# Patient Record
Sex: Male | Born: 1960
Health system: Southern US, Community
[De-identification: ages and names within clinical notes are randomized; demographics above are authoritative.]

---

## 2007-05-04 ENCOUNTER — Emergency Department: Payer: Self-pay | Admitting: Emergency Medicine

## 2007-05-05 ENCOUNTER — Other Ambulatory Visit: Payer: Self-pay

## 2016-01-26 DIAGNOSIS — F17200 Nicotine dependence, unspecified, uncomplicated: Secondary | ICD-10-CM | POA: Insufficient documentation

## 2016-01-26 DIAGNOSIS — F1410 Cocaine abuse, uncomplicated: Secondary | ICD-10-CM | POA: Insufficient documentation

## 2018-01-01 ENCOUNTER — Emergency Department: Payer: BLUE CROSS/BLUE SHIELD

## 2018-01-01 ENCOUNTER — Encounter: Payer: Self-pay | Admitting: Intensive Care

## 2018-01-01 ENCOUNTER — Emergency Department
Admission: EM | Admit: 2018-01-01 | Discharge: 2018-01-01 | Disposition: A | Discharge status: Home / Self Care | Payer: BLUE CROSS/BLUE SHIELD | Attending: Emergency Medicine | Admitting: Emergency Medicine

## 2018-01-01 DIAGNOSIS — M47816 Spondylosis without myelopathy or radiculopathy, lumbar region: Secondary | ICD-10-CM

## 2018-01-01 DIAGNOSIS — M479 Spondylosis, unspecified: Secondary | ICD-10-CM | POA: Insufficient documentation

## 2018-01-01 DIAGNOSIS — Z79899 Other long term (current) drug therapy: Secondary | ICD-10-CM | POA: Diagnosis not present

## 2018-01-01 DIAGNOSIS — M545 Low back pain: Secondary | ICD-10-CM | POA: Diagnosis present

## 2018-01-01 DIAGNOSIS — F17210 Nicotine dependence, cigarettes, uncomplicated: Secondary | ICD-10-CM | POA: Diagnosis not present

## 2018-01-01 MED ORDER — TRAMADOL HCL 50 MG PO TABS: 50.0000 mg | ORAL_TABLET | Freq: Four times a day (QID) | ORAL | 0 refills | Status: DC | PRN

## 2018-01-01 MED ORDER — MELOXICAM 15 MG PO TABS: 15.0000 mg | ORAL_TABLET | Freq: Every day | ORAL | 2 refills | Status: DC

## 2018-01-01 MED ORDER — NAPROXEN 500 MG PO TABS
500.0000 mg | ORAL_TABLET | Freq: Once | ORAL | Status: AC
  Administered 2018-01-01: 500 mg via ORAL
  Filled 2018-01-01: qty 1

## 2018-01-01 MED ORDER — CYCLOBENZAPRINE HCL 10 MG PO TABS: 10.0000 mg | ORAL_TABLET | Freq: Three times a day (TID) | ORAL | 0 refills | Status: DC | PRN

## 2018-01-01 MED ORDER — TRAMADOL HCL 50 MG PO TABS
50.0000 mg | ORAL_TABLET | Freq: Once | ORAL | Status: AC
  Administered 2018-01-01: 50 mg via ORAL
  Filled 2018-01-01: qty 1

## 2018-01-01 MED ORDER — CYCLOBENZAPRINE HCL 10 MG PO TABS
10.0000 mg | ORAL_TABLET | Freq: Once | ORAL | Status: AC
  Administered 2018-01-01: 10 mg via ORAL
  Filled 2018-01-01: qty 1

## 2018-01-01 NOTE — ED Triage Notes (Signed)
Patient c/o chronic lower back pain from pinched nerve. States it has progressively gotten worse within the last two days.

## 2018-01-01 NOTE — ED Notes (Signed)
See triage note  Presents with lower back pain   Hx of back pain d/t pinched nerve  States pain has increased over the past 2 days  Denies any recent injury

## 2018-01-01 NOTE — Discharge Instructions (Signed)
Advised to establish care with PCP. °

## 2018-01-01 NOTE — ED Provider Notes (Signed)
[facility] Emergency Department Provider Note   ____________________________________________   First MD Initiated Contact with Patient 01/01/18 1139     (approximate)  I have reviewed the triage vital signs and the nursing notes.   HISTORY  Chief Complaint Back Pain (lower; pinched nerve)    HPI Jonathan Mccarthy is a 57 y.o. male patient presents with chronic back pain please have a pinched nerve.  Pain is worse in the past 2 days.  Patient denies radicular component to his back pain.  Patient denies bladder bowel dysfunction.  Patient rates pain as a 9/10.  Patient described the pain is "aching".  No palliative measure for complaint.  History reviewed. No pertinent past medical history.  There are no active problems to display for this patient.   History reviewed. No pertinent surgical history.  Prior to Admission medications   Medication Sig Start Date End Date Taking? Authorizing Provider  cyclobenzaprine (FLEXERIL) 10 MG tablet Take 1 tablet (10 mg total) by mouth 3 (three) times daily as needed. 01/01/18   Joni Reining, PA-C  meloxicam (MOBIC) 15 MG tablet Take 1 tablet (15 mg total) by mouth daily. 01/01/18   Joni Reining, PA-C  traMADol (ULTRAM) 50 MG tablet Take 1 tablet (50 mg total) by mouth every 6 (six) hours as needed. 01/01/18 01/01/19  Joni Reining, PA-C    Allergies Patient has no known allergies.  History reviewed. No pertinent family history.  Social History Social History   Tobacco Use  . Smoking status: Current Every Day Smoker    Types: Cigarettes  . Smokeless tobacco: Never Used  Substance Use Topics  . Alcohol use: Yes  . Drug use: No    Review of Systems Constitutional: No fever/chills Eyes: No visual changes. ENT: No sore throat. Cardiovascular: Denies chest pain. Respiratory: Denies shortness of breath. Gastrointestinal: No abdominal pain.  No nausea, no vomiting.  No diarrhea.  No  constipation. Genitourinary: Negative for dysuria. Musculoskeletal: Chronic back pain Skin: Negative for rash. Neurological: Negative for headaches, focal weakness or numbness.   ____________________________________________   PHYSICAL EXAM:  VITAL SIGNS: ED Triage Vitals  Enc Vitals Group     BP 01/01/18 1119 134/90     Pulse Rate 01/01/18 1119 69     Resp 01/01/18 1119 16     Temp 01/01/18 1119 98.2 F (36.8 C)     Temp Source 01/01/18 1119 Oral     SpO2 01/01/18 1119 100 %     Weight 01/01/18 1120 135 lb (61.2 kg)     Height 01/01/18 1120 5\' 6"  (1.676 m)     Head Circumference --      Peak Flow --      Pain Score 01/01/18 1119 9     Pain Loc --      Pain Edu? --      Excl. in GC? --    Constitutional: Alert and oriented. Well appearing and in no acute distress. Eyes: Conjunctivae are normal. PERRL. EOMI. Head: Atraumatic. Nose: No congestion/rhinnorhea. Mouth/Throat: Mucous membranes are moist.  Oropharynx non-erythematous. Neck: No stridor.  Hematological/Lymphatic/Immunilogical: No cervical lymphadenopathy. Cardiovascular: Normal rate, regular rhythm. Grossly normal heart sounds.  Good peripheral circulation. Respiratory: Normal respiratory effort.  No retractions. Lungs CTAB. Gastrointestinal: Soft and nontender. No distention. No abdominal bruits. No CVA tenderness. Musculoskeletal: Patient is a regular lumbar spine show scoliosis.  Patient has moderate guarding palpation of L3 through L5.  Patient has decreased range of motion with  flexion limited by complaint of pain.  Patient has normal gait.  Patient has negative straight leg test.   Neurologic:  Normal speech and language. No gross focal neurologic deficits are appreciated. No gait instability. Skin:  Skin is warm, dry and intact. No rash noted. Psychiatric: Mood and affect are normal. Speech and behavior are normal.  ____________________________________________   LABS (all labs ordered are listed, but only  abnormal results are displayed)  Labs Reviewed - No data to display ____________________________________________  EKG   ____________________________________________  RADIOLOGY  ED MD interpretation: Degenerative changes with osteophytes from L3 through L5.  Patient also have moderate disc space loss between L3 through L5.  Official radiology report(s): Dg Lumbar Spine 2-3 Views  Result Date: 01/01/2018 CLINICAL DATA:  57 year old male with left side lumbar back pain radiating to both legs. Radicular back pain. EXAM: LUMBAR SPINE - 2-3 VIEW COMPARISON:  None. FINDINGS: Normal lumbar segmentation. Vestigial S1-S2 disc space. In general bone mineralization is normal. There is mild dextroconvex lumbar scoliosis associated with straightening of lumbar lordosis. There are diffuse bulky endplate osteophytes throughout the lumbar spine, most eccentric to the left and maximal at L3-L4 through L5-S1. Associated severe disc space loss and some vacuum disc phenomena at those levels. Associated endplate sclerosis. No acute osseous abnormality identified. The sacral ala and SI joints appear normal. IMPRESSION: 1. Diffuse bulky lumbar degenerative endplate osteophytes. Chronically advanced lumbar disc degeneration L3-L4 through L5-S1. 2. Mild dextroconvex lumbar spine curvature and straightening of lumbar lordosis. 3.  No acute osseous abnormality identified. Electronically Signed   By: Odessa FlemingH  Hall M.D.   On: 01/01/2018 12:11    ____________________________________________   PROCEDURES  Procedure(s) performed: None  Procedures  Critical Care performed: No  ____________________________________________   INITIAL IMPRESSION / ASSESSMENT AND PLAN / ED COURSE  As part of my medical decision making, I reviewed the following data within the electronic MEDICAL RECORD NUMBER Notes from prior ED visits and [place] Controlled Substance Database   Chronic low back pain secondary to osteoarthritis.  Discussed x-ray  findings with patient.  Discussed with patient rationale for having a family doctor.  Patient given discharge care instruction advised take medication as directed.      ____________________________________________   FINAL CLINICAL IMPRESSION(S) / ED DIAGNOSES  Final diagnoses:  Osteoarthritis of facet joint of lumbar spine     ED Discharge Orders        Ordered    meloxicam (MOBIC) 15 MG tablet  Daily     01/01/18 1223    cyclobenzaprine (FLEXERIL) 10 MG tablet  3 times daily PRN     01/01/18 1223    traMADol (ULTRAM) 50 MG tablet  Every 6 hours PRN     01/01/18 1223       Note:  This document was prepared using Dragon voice recognition software and may include unintentional dictation errors.    Joni ReiningSmith, Dartanyan Deasis K, PA-C 01/01/18 1229    Governor RooksLord, Rebecca, MD 01/01/18 [phone]

## 2018-01-21 ENCOUNTER — Other Ambulatory Visit: Payer: Self-pay

## 2018-01-21 ENCOUNTER — Encounter: Payer: Self-pay | Admitting: Family Medicine

## 2018-01-21 ENCOUNTER — Ambulatory Visit: Payer: Self-pay | Admitting: Family Medicine

## 2018-01-21 ENCOUNTER — Ambulatory Visit: Payer: BLUE CROSS/BLUE SHIELD | Admitting: Family Medicine

## 2018-01-21 VITALS — BP 130/70 | HR 96 | Temp 98.2°F | Resp 18 | Ht 66.0 in | Wt 135.0 lb

## 2018-01-21 DIAGNOSIS — M545 Low back pain, unspecified: Secondary | ICD-10-CM | POA: Insufficient documentation

## 2018-01-21 DIAGNOSIS — F17200 Nicotine dependence, unspecified, uncomplicated: Secondary | ICD-10-CM | POA: Diagnosis not present

## 2018-01-21 DIAGNOSIS — F1011 Alcohol abuse, in remission: Secondary | ICD-10-CM

## 2018-01-21 DIAGNOSIS — Z131 Encounter for screening for diabetes mellitus: Secondary | ICD-10-CM | POA: Diagnosis not present

## 2018-01-21 DIAGNOSIS — Z7689 Persons encountering health services in other specified circumstances: Secondary | ICD-10-CM

## 2018-01-21 DIAGNOSIS — Z87898 Personal history of other specified conditions: Secondary | ICD-10-CM

## 2018-01-21 DIAGNOSIS — R5383 Other fatigue: Secondary | ICD-10-CM

## 2018-01-21 DIAGNOSIS — R202 Paresthesia of skin: Secondary | ICD-10-CM

## 2018-01-21 DIAGNOSIS — Z1159 Encounter for screening for other viral diseases: Secondary | ICD-10-CM

## 2018-01-21 DIAGNOSIS — Z114 Encounter for screening for human immunodeficiency virus [HIV]: Secondary | ICD-10-CM

## 2018-01-21 DIAGNOSIS — M542 Cervicalgia: Secondary | ICD-10-CM

## 2018-01-21 DIAGNOSIS — G8929 Other chronic pain: Secondary | ICD-10-CM | POA: Diagnosis not present

## 2018-01-21 DIAGNOSIS — M549 Dorsalgia, unspecified: Secondary | ICD-10-CM

## 2018-01-21 DIAGNOSIS — Z13220 Encounter for screening for lipoid disorders: Secondary | ICD-10-CM

## 2018-01-21 NOTE — Progress Notes (Signed)
Name: Jonathan Mccarthy   MRN: [id number]    DOB: 12/07/1960   Date:01/21/2018       Progress Note  Subjective  Chief Complaint  Chief Complaint  Patient presents with  . Establish Care  . Back Pain    Seen in ER for back pain. Painradiates to arms and legs, hard to move, lift things or walk. Patient stated no known injury. Woke up like this.  . Neck Pain    HPI  Pt presents to establish care and for concern regarding back pain with extremity weakness. He had never had a PCP in the past as far as he can remember.  He was seen in the ER on 01/01/2018 for back pain.  He was given Meloxicam (once daily), flexeril (once daily), and tramadol (once daily).  Advised on our office policy that we do not prescribe narcotic pain medication and patient verbalizes understanding.  He does not feel like these medications have been helping his pain or strength.  Labs were not performed in ER, Xray as below  Xray Lumbar spine showed: "1. Diffuse bulky lumbar degenerative endplate osteophytes. Chronically advanced lumbar disc degeneration L3-L4 through L5-S1. 2. Mild dextroconvex lumbar spine curvature and straightening of lumbar lordosis. 3.  No acute osseous abnormality identified. Electronically Signed   By: H  Hall M.D.   On: 01/01/2018 12:11"  - Currently he endorses numbness and tingling in bilateral shoulders radiating from the neck; feels weak in BLE and reports this has been affecting his gait. - Denies any tremor or shaking.  He endorses one episode of incontinence because he was unable to get up fast enough due to feeling weak in BLE.  Denies headaches, chest pain, shortness of breath, vision changes, heat/cold intolerance, fevers/chills.  - He notes chronic LEFT arm weakness and decreased range of motion for several years. Also endorses chronic BUE weakness for several years.  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  He also has BLE weakness that has been making it  difficult for him to walk which has been an ongoing issue for several months. He works for [employer] in the [place] - has not been working for 3 weeks now due to symptoms.  He states that although symptoms have been a chronic issue, they have significantly worsened over the last 3 weeks. - ETOH Use: Drinks about 2-3 40oz every weekend.  - Drug Use: States used to use marijuana, cocaine - has not used since 2017 per patient report. 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  Patient Active Problem List   Diagnosis Date Noted  . Chronic bilateral low back pain without sciatica 01/21/2018  . Chronic neck and back pain 01/21/2018  . Paresthesias 01/21/2018  . Other fatigue 01/21/2018  . Cocaine abuse (HCC) 01/26/2016  . Tobacco use disorder 01/26/2016   History reviewed. No pertinent surgical history.  History reviewed. No pertinent family history.  Social History   Socioeconomic History  . Marital status: Single    Spouse name: Not on file  . Number of children: Not on file  . Years of education: Not on file  . Highest education level: Not on file  Social Needs  . Financial resource strain: Not on file  . Food insecurity - worry: Not on file  . Food insecurity - inability: Not on file  . Transportation needs - medical: Not on file  . Transportation needs - non-medical: Not on file    Occupational History  . Not on file  Tobacco Use  . Smoking status: Current Every Day Smoker    Types: Cigarettes  . Smokeless tobacco: Never Used  Substance and Sexual Activity  . Alcohol use: Yes  . Drug use: No  . Sexual activity: Not on file  Other Topics Concern  . Not on file  Social History Narrative  . Not on file     Current Outpatient Medications:  .  cyclobenzaprine (FLEXERIL) 10 MG tablet, Take 1 tablet (10 mg total) by mouth 3 (three) times daily as needed., Disp: 15 tablet, Rfl: 0 .  meloxicam (MOBIC) 15 MG tablet, Take 1 tablet (15  mg total) by mouth daily., Disp: 30 tablet, Rfl: 2 .  traMADol (ULTRAM) 50 MG tablet, Take 1 tablet (50 mg total) by mouth every 6 (six) hours as needed., Disp: 20 tablet, Rfl: 0  No Known Allergies  ROS  Ten systems reviewed and is negative except as mentioned in HPI  Objective  Vitals:   01/21/18 0833 01/21/18 0836  BP: 130/70   Pulse: (!) 103 96  Resp: 18   Temp: 98.2 F (36.8 C)   TempSrc: Oral   SpO2: 96%   Weight: 135 lb (61.2 kg)   Height: 5' 6" (1.676 m)    Body mass index is 21.79 kg/m. auscultated heart   Physical Exam Constitutional: Patient appears well-developed and well-nourished. No distress.  HENT: Head: Normocephalic and atraumatic.  Mouth/Throat: Oropharynx is clear and moist. No oropharyngeal exudate.  Eyes: Conjunctivae and EOM are normal. Pupils are equal, round, and reactive to light. No scleral icterus.  Neck: Normal range of motion. Neck supple. No JVD present. No thyromegaly present.  Cardiovascular: Normal rate, regular rhythm and normal heart sounds.  No murmur heard. No BLE edema. Pulmonary/Chest: Effort normal and breath sounds normal. No respiratory distress. Abdominal: Soft. There is no tenderness. no masses Musculoskeletal: Normal range of motion, no joint effusions. No gross deformities Neurological: he is alert and oriented to person, place, and time. No cranial nerve deficit. Coordination and speech are normal.  Gait is unsteady - needs to hold onto rail/wall to ambulate.  Strength - equal and + 4 to BLE, +2 to BUE with decreased AROM of the LEFT shoulder joint. Bilaterally. Skin: Skin is warm and dry. No rash noted. No erythema.  Psychiatric: Patient has a normal mood and affect. behavior is normal. Judgment and thought content normal.  No results found for this or any previous visit (from the past 72 hour(s)).  PHQ2/9: Depression screen PHQ 2/9 01/21/2018  Decreased Interest 0  Down, Depressed, Hopeless 0  PHQ - 2 Score 0   Fall  Risk: Fall Risk  01/21/2018  Falls in the past year? No  Risk for fall due to : Impaired balance/gait;Impaired mobility   Functional Status Survey: Is the patient deaf or have difficulty hearing?: No Does the patient have difficulty seeing, even when wearing glasses/contacts?: No Does the patient have difficulty concentrating, remembering, or making decisions?: No Does the patient have difficulty walking or climbing stairs?: Yes Does the patient have difficulty dressing or bathing?: Yes Does the patient have difficulty doing errands alone such as visiting a doctor's office or shopping?: Yes  Assessment & Plan  1. Chronic bilateral low back pain without sciatica - Nerve conduction test; Future  2. Chronic neck and back pain - Nerve conduction test; Future  3. Paresthesias - Nerve conduction test; Future - COMPLETE METABOLIC PANEL WITH GFR - Vitamin B1 -  Vitamin B12  4. Other fatigue - TSH - CBC w/Diff/Platelet - COMPLETE METABOLIC PANEL WITH GFR - Vitamin B1 - Vitamin B12 - Hemoglobin A1c  5. Diabetes mellitus screening - COMPLETE METABOLIC PANEL WITH GFR - Hemoglobin A1c  6. Screening for hyperlipidemia - Lipid panel  7. Encounter to establish care Return in about 2 weeks (around 02/04/2018) for 2 week Follow Up/Schedule CPE when able . - Discussed patient's present symptoms and situation at length.  He lives alone, we discussed fall prevention precautions in detail.  He cooks and does his own grocery shopping.  He declines social services or care management consult today, prefers to have work-up done first.  He is aware that I am willing to send either of these referrals in for him. - His friend, Laverna Peace, joined the appointment at the end and asked if I could complete paperwork for short-term disability - I politely declined as I have only met patient today and do not have a clear clinical picture of the patient's disease state at this time, therefore I would be unable to  accurately complete any forms regarding his health.   - He is in agreement to have health maintenance items updated today except for colonoscopy which he would like to wait on until his acute concerns are better managed.  8. Tobacco use disorder - Discussed need for cessation in detail  9. History of ETOH abuse - Discussed need to decrease ETOH use on the weekends. - CBC w/Diff/Platelet - Vitamin B1 - Vitamin B12  10. Encounter for screening for HIV - HIV antibody  11. Need for hepatitis C screening test - Hepatitis C antibody  -Reviewed Health Maintenance: See orders; declines colonoscopy referral today, will consider at future visit.  We will update TDAP when available again in our office. - Face-to-face time with patient was more than 25 minutes, >50% time spent counseling and coordination of care

## 2018-01-26 LAB — COMPLETE METABOLIC PANEL WITH GFR
AG Ratio: 1.3 (calc) (ref 1.0–2.5)
ALBUMIN MSPROF: 4.1 g/dL (ref 3.6–5.1)
ALT: 15 U/L (ref 9–46)
AST: 20 U/L (ref 10–35)
Alkaline phosphatase (APISO): 65 U/L (ref 40–115)
BUN: 15 mg/dL (ref 7–25)
CO2: 31 mmol/L (ref 20–32)
CREATININE: 0.87 mg/dL (ref 0.70–1.33)
Calcium: 9.4 mg/dL (ref 8.6–10.3)
Chloride: 106 mmol/L (ref 98–110)
GFR, Est African American: 110 mL/min/{1.73_m2} (ref >=60)
GFR, Est Non African American: 95 mL/min/{1.73_m2} (ref >=60)
GLUCOSE: 97 mg/dL (ref 65–99)
Globulin: 3.2 g/dL (calc) (ref 1.9–3.7)
Potassium: 4 mmol/L (ref 3.5–5.3)
Sodium: 141 mmol/L (ref 135–146)
Total Bilirubin: 0.3 mg/dL (ref 0.2–1.2)
Total Protein: 7.3 g/dL (ref 6.1–8.1)

## 2018-01-26 LAB — HEPATITIS C ANTIBODY
HEP C AB: NONREACTIVE
SIGNAL TO CUT-OFF: 0.04 (ref <1.00)

## 2018-01-26 LAB — CBC WITH DIFFERENTIAL/PLATELET
Basophils Absolute: 18 cells/uL (ref 0–200)
Basophils Relative: 0.4 %
EOS PCT: 2.2 %
Eosinophils Absolute: 101 cells/uL (ref 15–500)
HCT: 42.5 % (ref 38.5–50.0)
HEMOGLOBIN: 14.4 g/dL (ref 13.2–17.1)
Lymphs Abs: 1233 cells/uL (ref 850–3900)
MCH: 28 pg (ref 27.0–33.0)
MCHC: 33.9 g/dL (ref 32.0–36.0)
MCV: 82.7 fL (ref 80.0–100.0)
MONOS PCT: 6.5 %
MPV: 10.4 fL (ref 7.5–12.5)
Neutro Abs: 2949 cells/uL (ref 1500–7800)
Neutrophils Relative %: 64.1 %
Platelets: 255 10*3/uL (ref 140–400)
RBC: 5.14 10*6/uL (ref 4.20–5.80)
RDW: 13.5 % (ref 11.0–15.0)
Total Lymphocyte: 26.8 %
WBC mixed population: 299 cells/uL (ref 200–950)
WBC: 4.6 10*3/uL (ref 3.8–10.8)

## 2018-01-26 LAB — LIPID PANEL
CHOL/HDL RATIO: 2.3 (calc) (ref <5.0)
Cholesterol: 128 mg/dL (ref <200)
HDL: 56 mg/dL (ref >40)
LDL CHOLESTEROL (CALC): 59 mg/dL
Non-HDL Cholesterol (Calc): 72 mg/dL (calc) (ref <130)
Triglycerides: 54 mg/dL (ref <150)

## 2018-01-26 LAB — HEMOGLOBIN A1C
EAG (MMOL/L): 6.3 (calc)
Hgb A1c MFr Bld: 5.6 % of total Hgb (ref <5.7)
Mean Plasma Glucose: 114 (calc)

## 2018-01-26 LAB — TSH: TSH: 2.51 m[IU]/L (ref 0.40–4.50)

## 2018-01-26 LAB — VITAMIN B12: Vitamin B-12: 387 pg/mL (ref 200–1100)

## 2018-01-26 LAB — VITAMIN B1: VITAMIN B1 (THIAMINE): 10 nmol/L (ref 8–30)

## 2018-01-26 LAB — HIV ANTIBODY (ROUTINE TESTING W REFLEX): HIV: NONREACTIVE

## 2018-02-03 ENCOUNTER — Other Ambulatory Visit: Payer: Self-pay

## 2018-02-03 ENCOUNTER — Emergency Department
Admission: EM | Admit: 2018-02-03 | Discharge: 2018-02-04 | Disposition: A | Discharge status: Other Acute Inpatient Hospital | Payer: BLUE CROSS/BLUE SHIELD | Attending: Emergency Medicine | Admitting: Emergency Medicine

## 2018-02-03 ENCOUNTER — Encounter: Payer: Self-pay | Admitting: Emergency Medicine

## 2018-02-03 DIAGNOSIS — S12390A Other displaced fracture of fourth cervical vertebra, initial encounter for closed fracture: Secondary | ICD-10-CM | POA: Diagnosis not present

## 2018-02-03 DIAGNOSIS — Z79899 Other long term (current) drug therapy: Secondary | ICD-10-CM | POA: Insufficient documentation

## 2018-02-03 DIAGNOSIS — Y939 Activity, unspecified: Secondary | ICD-10-CM | POA: Insufficient documentation

## 2018-02-03 DIAGNOSIS — G9520 Unspecified cord compression: Secondary | ICD-10-CM

## 2018-02-03 DIAGNOSIS — Y92009 Unspecified place in unspecified non-institutional (private) residence as the place of occurrence of the external cause: Secondary | ICD-10-CM | POA: Insufficient documentation

## 2018-02-03 DIAGNOSIS — Y999 Unspecified external cause status: Secondary | ICD-10-CM | POA: Insufficient documentation

## 2018-02-03 DIAGNOSIS — F17210 Nicotine dependence, cigarettes, uncomplicated: Secondary | ICD-10-CM | POA: Insufficient documentation

## 2018-02-03 DIAGNOSIS — W1839XA Other fall on same level, initial encounter: Secondary | ICD-10-CM | POA: Diagnosis not present

## 2018-02-03 DIAGNOSIS — S1980XA Other specified injuries of unspecified part of neck, initial encounter: Secondary | ICD-10-CM | POA: Diagnosis present

## 2018-02-03 MED ORDER — ONDANSETRON HCL 4 MG/2ML IJ SOLN
4.0000 mg | Freq: Once | INTRAMUSCULAR | Status: AC
  Administered 2018-02-04: 4 mg via INTRAVENOUS
  Filled 2018-02-03: qty 2

## 2018-02-03 MED ORDER — MORPHINE SULFATE (PF) 4 MG/ML IV SOLN
4.0000 mg | Freq: Once | INTRAVENOUS | Status: AC
  Administered 2018-02-04: 4 mg via INTRAVENOUS
  Filled 2018-02-03: qty 1

## 2018-02-03 NOTE — ED Provider Notes (Addendum)
[facility] Emergency Department Provider Note   Time seen: 11:25 PM I have reviewed the triage vital signs and the nursing notes.   HISTORY  Chief Complaint Fall; Back Pain; and Numbness    HPI Jonathan Mccarthy is a 57 y.o. male with below list of chronic medical conditions presents the emergency department following multiple falls.  Patient states that he fell last night and again this morning secondary to weakness in bilateral lower extremity.  Patient admits to bilateral arm pain numbness and weakness.  Patient also admits to bilateral extremity weakness with inability to ambulate at this time.  Patient also admits to posterior neck pain.  Patient states his current pain score is 9 out of 10.  Loss of consciousness   History reviewed. No pertinent past medical history.  Patient Active Problem List   Diagnosis Date Noted  . Chronic bilateral low back pain without sciatica 01/21/2018  . Chronic neck and back pain 01/21/2018  . Paresthesias 01/21/2018  . Other fatigue 01/21/2018  . Cocaine abuse (HCC) 01/26/2016  . Tobacco use disorder 01/26/2016    History reviewed. No pertinent surgical history.  Prior to Admission medications   Medication Sig Start Date End Date Taking? Authorizing Provider  cyclobenzaprine (FLEXERIL) 10 MG tablet Take 1 tablet (10 mg total) by mouth 3 (three) times daily as needed. 01/01/18   Joni Reining, PA-C  meloxicam (MOBIC) 15 MG tablet Take 1 tablet (15 mg total) by mouth daily. 01/01/18   Joni Reining, PA-C  traMADol (ULTRAM) 50 MG tablet Take 1 tablet (50 mg total) by mouth every 6 (six) hours as needed. 01/01/18 01/01/19  Joni Reining, PA-C    Allergies No known drug allergies History reviewed. No pertinent family history.  Social History Social History   Tobacco Use  . Smoking status: Current Every Day Smoker    Types: Cigarettes  . Smokeless tobacco: Never Used  Substance Use Topics  . Alcohol use: Yes   . Drug use: No    Review of Systems Constitutional: No fever/chills Eyes: No visual changes. ENT: No sore throat. Cardiovascular: Denies chest pain. Respiratory: Denies shortness of breath. Gastrointestinal: No abdominal pain.  No nausea, no vomiting.  No diarrhea.  No constipation. Genitourinary: Negative for dysuria. Musculoskeletal: Negative for neck pain.  Negative for back pain. Integumentary: Negative for rash. Neurological: Positive for posterior arm leg pain weakness and numbness   ____________________________________________   PHYSICAL EXAM:  VITAL SIGNS: ED Triage Vitals  Enc Vitals Group     BP 02/03/18 2220 127/89     Pulse Rate 02/03/18 2220 72     Resp 02/03/18 2220 18     Temp 02/03/18 2220 98.2 F (36.8 C)     Temp Source 02/03/18 2220 Oral     SpO2 02/03/18 2220 98 %     Weight 02/03/18 2221 59.9 kg (132 lb)     Height 02/03/18 2221 1.676 m (5\' 6" )     Head Circumference --      Peak Flow --      Pain Score 02/03/18 2221 8     Pain Loc --      Pain Edu? --      Excl. in GC? --     Constitutional: Alert and oriented.  Apparent discomfort  eyes: Conjunctivae are normal.  Head: Atraumatic. Mouth/Throat: Mucous membranes are moist. Oropharynx non-erythematous. Neck: No stridor.   Cardiovascular: Normal rate, regular rhythm. Good peripheral circulation. Grossly normal heart  sounds. Respiratory: Normal respiratory effort.  No retractions. Lungs CTAB. Gastrointestinal: Soft and nontender. No distention.  Musculoskeletal: No lower extremity tenderness nor edema. No gross deformities of extremities. Neurologic:  Normal speech and language.  Bilateral arms held in partial flexion (contracted).  Inability to raise bilateral legs off the bed patient states that this is chronic) Skin:  Skin is warm, dry and intact. No rash noted. Psychiatric: Mood and affect are normal. Speech and behavior are normal.    RADIOLOGY I, [place] Khristin Keleher, personally viewed  and evaluated these images (plain radiographs) as part of my medical decision making, as well as reviewing the written report by the radiologist.   Official radiology report(s): Mr Cervical Spine Wo Contrast  Result Date: 02/04/2018 CLINICAL DATA:  Fall with back pain, bilateral upper extremity numbness. EXAM: MRI CERVICAL, THORACIC AND LUMBAR SPINE WITHOUT CONTRAST TECHNIQUE: Multiplanar and multiecho pulse sequences of the cervical spine, to include the craniocervical junction and cervicothoracic junction, and thoracic and lumbar spine, were obtained without intravenous contrast. COMPARISON:  None. FINDINGS: MRI CERVICAL SPINE FINDINGS Alignment: Physiologic. Vertebrae: There is edema of the posterior elements at the C4-5 level. No other focal marrow signal abnormality. There is edema underlying the ligamentum flavum at the C3-C5 levels but no focal interruption of the ligament. Posterior longitudinal ligament is intact. There is mild edema of the interspinous ligament. C6-C7 ACDF. Cord: There is hyperintense T2-weighted signal within the spinal cord at the C4-5 level. The remainder of the cord parenchymal signal is normal. Posterior Fossa, vertebral arteries, paraspinal tissues: Visualized posterior fossa is normal. Vertebral artery flow voids are preserved. No prevertebral soft tissue swelling. Disc levels: C1-C2: No stenosis. C2-C3: Medium-sized central disc protrusion with mild spinal canal stenosis. Mild-to-moderate narrowing of the right neural foramen. C3-C4: Small disc osteophyte complex with mild spinal canal stenosis. Mild-to-moderate bilateral foraminal narrowing. C4-C5: Posterior element edema and possible spinous process fracture. Large disc osteophyte complex with severe spinal canal stenosis and mild compression of the spinal cord. Severe left facet hypertrophy. C5-C6: Mild disc bulge. No stenosis. Mild uncovertebral hypertrophy. C6-C7: Disc space narrowing without herniation or stenosis.  Anterior fusion. C7-T1: No herniation or stenosis. MRI THORACIC SPINE FINDINGS Alignment:  Physiologic. Vertebrae: No fracture, evidence of discitis, or bone lesion. Cord:  Normal signal and morphology. Paraspinal and other soft tissues: Trace pleural effusions. Disc levels: Mild multilevel degenerative disc disease without stenosis. MRI LUMBAR SPINE FINDINGS Segmentation:  Standard. Alignment:  Physiologic. Vertebrae: Multilevel degenerative endplate signal change. No acute fracture. Conus medullaris and cauda equina: Conus extends to the L1 level. Conus and cauda equina appear normal. Paraspinal and other soft tissues: The visualized vascular, retroperitoneal and paraspinal structures are normal. Disc levels: T12-L1: Right subarticular/foraminal protrusion with mild foraminal narrowing. L1-L2: No herniation or stenosis. L2-L3: Mild diffuse disc bulge. No spinal canal stenosis. Mild left foraminal narrowing. L3-L4: Diffuse disc bulge with mild right and moderate left foraminal stenosis. L4-L5: Medium-sized diffuse disc bulge. No spinal canal stenosis. Left-greater-than-right lateral recess stenosis. Moderate right and severe left neural foraminal stenosis. L5-S1: Disc space narrowing without spinal canal stenosis. Severe bilateral neural foraminal stenosis. Severe facet hypertrophy. IMPRESSION: 1. Compression of the cervical spinal cord at the C4-5 level with short segment spinal cord edema. 2. Edema within the C4-5 posterior elements and underlying the ligamentum flavum. In the setting of recent fall, this is concerning for acute cervical spine fracture. CT of the cervical spine is recommended to more completely characterized the bones. No ligamentous interruption. 3. Multilevel  mild-to-moderate foraminal stenosis of the cervical spine. 4. No large disc herniation or stenosis of thoracic spine. 5. Multilevel lumbar degenerative disc disease without spinal canal stenosis. Moderate-to-severe neural foraminal  stenosis at L3-4, L4-5 and L5-S1. Critical Value/emergent results were called by telephone at the time of interpretation on 02/04/2018 at 2:37 am to Dr. Bayard MalesANDOLPH Advika Mclelland , who verbally acknowledged these results. Electronically Signed   By: Deatra RobinsonKevin  Herman M.D.   On: 02/04/2018 02:42   Mr Thoracic Spine Wo Contrast  Result Date: 02/04/2018 CLINICAL DATA:  Fall with back pain, bilateral upper extremity numbness. EXAM: MRI CERVICAL, THORACIC AND LUMBAR SPINE WITHOUT CONTRAST TECHNIQUE: Multiplanar and multiecho pulse sequences of the cervical spine, to include the craniocervical junction and cervicothoracic junction, and thoracic and lumbar spine, were obtained without intravenous contrast. COMPARISON:  None. FINDINGS: MRI CERVICAL SPINE FINDINGS Alignment: Physiologic. Vertebrae: There is edema of the posterior elements at the C4-5 level. No other focal marrow signal abnormality. There is edema underlying the ligamentum flavum at the C3-C5 levels but no focal interruption of the ligament. Posterior longitudinal ligament is intact. There is mild edema of the interspinous ligament. C6-C7 ACDF. Cord: There is hyperintense T2-weighted signal within the spinal cord at the C4-5 level. The remainder of the cord parenchymal signal is normal. Posterior Fossa, vertebral arteries, paraspinal tissues: Visualized posterior fossa is normal. Vertebral artery flow voids are preserved. No prevertebral soft tissue swelling. Disc levels: C1-C2: No stenosis. C2-C3: Medium-sized central disc protrusion with mild spinal canal stenosis. Mild-to-moderate narrowing of the right neural foramen. C3-C4: Small disc osteophyte complex with mild spinal canal stenosis. Mild-to-moderate bilateral foraminal narrowing. C4-C5: Posterior element edema and possible spinous process fracture. Large disc osteophyte complex with severe spinal canal stenosis and mild compression of the spinal cord. Severe left facet hypertrophy. C5-C6: Mild disc bulge. No  stenosis. Mild uncovertebral hypertrophy. C6-C7: Disc space narrowing without herniation or stenosis. Anterior fusion. C7-T1: No herniation or stenosis. MRI THORACIC SPINE FINDINGS Alignment:  Physiologic. Vertebrae: No fracture, evidence of discitis, or bone lesion. Cord:  Normal signal and morphology. Paraspinal and other soft tissues: Trace pleural effusions. Disc levels: Mild multilevel degenerative disc disease without stenosis. MRI LUMBAR SPINE FINDINGS Segmentation:  Standard. Alignment:  Physiologic. Vertebrae: Multilevel degenerative endplate signal change. No acute fracture. Conus medullaris and cauda equina: Conus extends to the L1 level. Conus and cauda equina appear normal. Paraspinal and other soft tissues: The visualized vascular, retroperitoneal and paraspinal structures are normal. Disc levels: T12-L1: Right subarticular/foraminal protrusion with mild foraminal narrowing. L1-L2: No herniation or stenosis. L2-L3: Mild diffuse disc bulge. No spinal canal stenosis. Mild left foraminal narrowing. L3-L4: Diffuse disc bulge with mild right and moderate left foraminal stenosis. L4-L5: Medium-sized diffuse disc bulge. No spinal canal stenosis. Left-greater-than-right lateral recess stenosis. Moderate right and severe left neural foraminal stenosis. L5-S1: Disc space narrowing without spinal canal stenosis. Severe bilateral neural foraminal stenosis. Severe facet hypertrophy. IMPRESSION: 1. Compression of the cervical spinal cord at the C4-5 level with short segment spinal cord edema. 2. Edema within the C4-5 posterior elements and underlying the ligamentum flavum. In the setting of recent fall, this is concerning for acute cervical spine fracture. CT of the cervical spine is recommended to more completely characterized the bones. No ligamentous interruption. 3. Multilevel mild-to-moderate foraminal stenosis of the cervical spine. 4. No large disc herniation or stenosis of thoracic spine. 5. Multilevel  lumbar degenerative disc disease without spinal canal stenosis. Moderate-to-severe neural foraminal stenosis at L3-4, L4-5 and L5-S1. Critical Value/emergent  results were called by telephone at the time of interpretation on 02/04/2018 at 2:37 am to Dr. Bayard Males , who verbally acknowledged these results. Electronically Signed   By: Deatra Robinson M.D.   On: 02/04/2018 02:42   Mr Lumbar Spine Wo Contrast  Result Date: 02/04/2018 CLINICAL DATA:  Fall with back pain, bilateral upper extremity numbness. EXAM: MRI CERVICAL, THORACIC AND LUMBAR SPINE WITHOUT CONTRAST TECHNIQUE: Multiplanar and multiecho pulse sequences of the cervical spine, to include the craniocervical junction and cervicothoracic junction, and thoracic and lumbar spine, were obtained without intravenous contrast. COMPARISON:  None. FINDINGS: MRI CERVICAL SPINE FINDINGS Alignment: Physiologic. Vertebrae: There is edema of the posterior elements at the C4-5 level. No other focal marrow signal abnormality. There is edema underlying the ligamentum flavum at the C3-C5 levels but no focal interruption of the ligament. Posterior longitudinal ligament is intact. There is mild edema of the interspinous ligament. C6-C7 ACDF. Cord: There is hyperintense T2-weighted signal within the spinal cord at the C4-5 level. The remainder of the cord parenchymal signal is normal. Posterior Fossa, vertebral arteries, paraspinal tissues: Visualized posterior fossa is normal. Vertebral artery flow voids are preserved. No prevertebral soft tissue swelling. Disc levels: C1-C2: No stenosis. C2-C3: Medium-sized central disc protrusion with mild spinal canal stenosis. Mild-to-moderate narrowing of the right neural foramen. C3-C4: Small disc osteophyte complex with mild spinal canal stenosis. Mild-to-moderate bilateral foraminal narrowing. C4-C5: Posterior element edema and possible spinous process fracture. Large disc osteophyte complex with severe spinal canal stenosis and  mild compression of the spinal cord. Severe left facet hypertrophy. C5-C6: Mild disc bulge. No stenosis. Mild uncovertebral hypertrophy. C6-C7: Disc space narrowing without herniation or stenosis. Anterior fusion. C7-T1: No herniation or stenosis. MRI THORACIC SPINE FINDINGS Alignment:  Physiologic. Vertebrae: No fracture, evidence of discitis, or bone lesion. Cord:  Normal signal and morphology. Paraspinal and other soft tissues: Trace pleural effusions. Disc levels: Mild multilevel degenerative disc disease without stenosis. MRI LUMBAR SPINE FINDINGS Segmentation:  Standard. Alignment:  Physiologic. Vertebrae: Multilevel degenerative endplate signal change. No acute fracture. Conus medullaris and cauda equina: Conus extends to the L1 level. Conus and cauda equina appear normal. Paraspinal and other soft tissues: The visualized vascular, retroperitoneal and paraspinal structures are normal. Disc levels: T12-L1: Right subarticular/foraminal protrusion with mild foraminal narrowing. L1-L2: No herniation or stenosis. L2-L3: Mild diffuse disc bulge. No spinal canal stenosis. Mild left foraminal narrowing. L3-L4: Diffuse disc bulge with mild right and moderate left foraminal stenosis. L4-L5: Medium-sized diffuse disc bulge. No spinal canal stenosis. Left-greater-than-right lateral recess stenosis. Moderate right and severe left neural foraminal stenosis. L5-S1: Disc space narrowing without spinal canal stenosis. Severe bilateral neural foraminal stenosis. Severe facet hypertrophy. IMPRESSION: 1. Compression of the cervical spinal cord at the C4-5 level with short segment spinal cord edema. 2. Edema within the C4-5 posterior elements and underlying the ligamentum flavum. In the setting of recent fall, this is concerning for acute cervical spine fracture. CT of the cervical spine is recommended to more completely characterized the bones. No ligamentous interruption. 3. Multilevel mild-to-moderate foraminal stenosis of the  cervical spine. 4. No large disc herniation or stenosis of thoracic spine. 5. Multilevel lumbar degenerative disc disease without spinal canal stenosis. Moderate-to-severe neural foraminal stenosis at L3-4, L4-5 and L5-S1. Critical Value/emergent results were called by telephone at the time of interpretation on 02/04/2018 at 2:37 am to Dr. Bayard Males , who verbally acknowledged these results. Electronically Signed   By: Deatra Robinson M.D.   On: 02/04/2018 02:42  ____________________________________________     .Critical Care Performed by: Darci Current, MD Authorized by: Darci Current, MD   Critical care provider statement:    Critical care time (minutes):  45   Critical care start time:  02/03/2018 11:25 PM   Critical care end time:  02/04/2018 7:43 AM   Critical care time was exclusive of:  Separately billable procedures and treating other patients and teaching time   Critical care was necessary to treat or prevent imminent or life-threatening deterioration of the following conditions:  CNS failure or compromise   Critical care was time spent personally by me on the following activities:  Development of treatment plan with patient or surrogate, discussions with consultants, evaluation of patient's response to treatment, examination of patient, obtaining history from patient or surrogate, ordering and performing treatments and interventions, ordering and review of laboratory studies, ordering and review of radiographic studies, pulse oximetry, re-evaluation of patient's condition and review of old charts   I assumed direction of critical care for this patient from another provider in my specialty: no       ____________________________________________   INITIAL IMPRESSION / ASSESSMENT AND PLAN / ED COURSE  As part of my medical decision making, I reviewed the following data within the electronic MEDICAL RECORD NUMBER  57 year old male presenting with above-stated history and  physical exam secondary to accidental fall.  Concern for possible spinal cord injury and as such MRI of the cervical thoracic and lumbar spine was obtained.  MRI revealed C4-C5 spinal cord compression with accompanying edema.  Cervical collar was placed the patient Decadron 20 mg IV was given.  Patient discussed with Dr. Adriana Simas neurosurgeon on call who recommended that the patient be transferred to [facility] however [facility] has no available beds at this time.  As such patient discussed with [facility] who accepted the patient has a red trauma patient to Dr. Morrie Sheldon.  Patient discussed with Dr. Morrie Sheldon as well.  ____________________________________________  FINAL CLINICAL IMPRESSION(S) / ED DIAGNOSES  Final diagnoses:  Cervical spinal cord compression (HCC)  Other closed displaced fracture of fourth cervical vertebra, initial encounter [facility])     MEDICATIONS GIVEN DURING THIS VISIT:  Medications  morphine 4 MG/ML injection 4 mg (4 mg Intravenous Given 02/04/18 0006)  ondansetron (ZOFRAN) injection 4 mg (4 mg Intravenous Given 02/04/18 0005)  LORazepam (ATIVAN) injection 1 mg (1 mg Intravenous Given 02/04/18 0108)  dexamethasone (DECADRON) injection 20 mg (20 mg Intravenous Given 02/04/18 0245)     ED Discharge Orders    None       Note:  This document was prepared using Dragon voice recognition software and may include unintentional dictation errors.    Darci Current, MD 02/04/18 0745    Darci Current, MD 02/04/18 0745    Darci Current, MD 02/04/18 [phone]

## 2018-02-03 NOTE — ED Triage Notes (Signed)
Pt arrived to the ED from home via EMS for complaints of back pain, bilateral arm numbness and arm contraction secondary to falling. Pt reports that he was seen in the ED a couple of weeks ago for falling and back pain and was told that he had an injury in his spine. Pt states that the contraction of his arms and numbness of his arms is new and it was aggravated by falling today. Pt is AOx4 in no apparent distress.

## 2018-02-04 ENCOUNTER — Emergency Department: Payer: BLUE CROSS/BLUE SHIELD

## 2018-02-04 MED ORDER — LORAZEPAM 2 MG/ML IJ SOLN
1.0000 mg | Freq: Once | INTRAMUSCULAR | Status: AC
  Administered 2018-02-04: 1 mg via INTRAVENOUS

## 2018-02-04 MED ORDER — LORAZEPAM 2 MG/ML IJ SOLN
INTRAMUSCULAR | Status: AC
  Administered 2018-02-04: 1 mg via INTRAVENOUS
  Filled 2018-02-04: qty 1

## 2018-02-04 MED ORDER — DEXAMETHASONE SODIUM PHOSPHATE 10 MG/ML IJ SOLN
20.0000 mg | Freq: Once | INTRAMUSCULAR | Status: AC
  Administered 2018-02-04: 20 mg via INTRAVENOUS
  Filled 2018-02-04: qty 2

## 2018-02-04 NOTE — ED Notes (Signed)
Pt returned from MRI °

## 2018-02-04 NOTE — ED Notes (Signed)
MRI tech conducted his MRI check list over the phone.

## 2018-02-04 NOTE — ED Notes (Signed)
[facility]

## 2018-02-04 NOTE — ED Notes (Signed)
Pt placed on a C-collar per MD's order.

## 2018-02-04 NOTE — ED Notes (Signed)
Pt went to MRI.

## 2018-02-04 NOTE — ED Notes (Signed)
EMTALA checked for completion  

## 2018-02-05 ENCOUNTER — Ambulatory Visit: Payer: BLUE CROSS/BLUE SHIELD | Admitting: Family Medicine

## 2018-02-11 MED ORDER — ACETAMINOPHEN 325 MG PO TABS
650.00 mg | ORAL_TABLET | ORAL | Status: DC
Start: ? — End: 2018-02-11

## 2018-02-11 MED ORDER — ENOXAPARIN SODIUM 40 MG/0.4ML ~~LOC~~ SOLN
40.00 mg | SUBCUTANEOUS | Status: DC
Start: 2018-02-12 — End: 2018-02-11

## 2018-02-11 MED ORDER — GENERIC EXTERNAL MEDICATION
Status: DC
Start: ? — End: 2018-02-11

## 2018-02-11 MED ORDER — ACETAMINOPHEN 500 MG PO TABS
500.00 mg | ORAL_TABLET | ORAL | Status: DC
Start: 2018-02-11 — End: 2018-02-11

## 2018-02-11 MED ORDER — LIDOCAINE 5 % EX PTCH
1.00 | MEDICATED_PATCH | CUTANEOUS | Status: DC
Start: 2018-02-12 — End: 2018-02-11

## 2018-02-11 MED ORDER — GABAPENTIN 300 MG PO CAPS
300.00 mg | ORAL_CAPSULE | ORAL | Status: DC
Start: 2018-02-11 — End: 2018-02-11

## 2018-02-11 MED ORDER — MELATONIN 3 MG PO TABS
6.00 mg | ORAL_TABLET | ORAL | Status: DC
Start: ? — End: 2018-02-11

## 2018-02-11 MED ORDER — ONDANSETRON 4 MG PO TBDP
4.00 mg | ORAL_TABLET | ORAL | Status: DC
Start: ? — End: 2018-02-11

## 2018-02-11 MED ORDER — CYCLOBENZAPRINE HCL 10 MG PO TABS
10.00 mg | ORAL_TABLET | ORAL | Status: DC
Start: ? — End: 2018-02-11

## 2018-02-11 MED ORDER — ALBUTEROL SULFATE HFA 108 (90 BASE) MCG/ACT IN AERS
2.00 | INHALATION_SPRAY | RESPIRATORY_TRACT | Status: DC
Start: ? — End: 2018-02-11

## 2018-03-03 ENCOUNTER — Encounter: Payer: BLUE CROSS/BLUE SHIELD | Admitting: Family Medicine

## 2018-03-14 ENCOUNTER — Other Ambulatory Visit: Payer: Self-pay | Admitting: Nurse Practitioner

## 2018-03-14 ENCOUNTER — Telehealth: Payer: Self-pay | Admitting: Family Medicine

## 2018-03-14 DIAGNOSIS — R202 Paresthesia of skin: Secondary | ICD-10-CM

## 2018-03-14 DIAGNOSIS — M545 Low back pain: Principal | ICD-10-CM

## 2018-03-14 DIAGNOSIS — M542 Cervicalgia: Secondary | ICD-10-CM

## 2018-03-14 DIAGNOSIS — M549 Dorsalgia, unspecified: Secondary | ICD-10-CM

## 2018-03-14 DIAGNOSIS — G8929 Other chronic pain: Secondary | ICD-10-CM

## 2018-03-14 NOTE — Telephone Encounter (Signed)
Copied from CRM [id number]. Topic: Quick Communication - See Telephone Encounter >> Mar 14, 2018  2:49 PM Rudi Coco, NT wrote: CRM for notification. See Telephone encounter for: 03/14/18.  Clydie Braun calling from [facility] home health requesting Verbal orders for pt. To have PT at home. Pt. Has declined OT and nursing

## 2018-05-27 ENCOUNTER — Emergency Department
Admission: EM | Admit: 2018-05-27 | Discharge: 2018-05-27 | Disposition: A | Discharge status: Home / Self Care | Payer: BLUE CROSS/BLUE SHIELD | Attending: Emergency Medicine | Admitting: Emergency Medicine

## 2018-05-27 ENCOUNTER — Other Ambulatory Visit: Payer: Self-pay

## 2018-05-27 DIAGNOSIS — F17210 Nicotine dependence, cigarettes, uncomplicated: Secondary | ICD-10-CM | POA: Insufficient documentation

## 2018-05-27 DIAGNOSIS — M545 Low back pain, unspecified: Secondary | ICD-10-CM

## 2018-05-27 DIAGNOSIS — G8929 Other chronic pain: Secondary | ICD-10-CM | POA: Insufficient documentation

## 2018-05-27 MED ORDER — TRAMADOL HCL 50 MG PO TABS
50.0000 mg | ORAL_TABLET | Freq: Once | ORAL | Status: AC
  Administered 2018-05-27: 50 mg via ORAL
  Filled 2018-05-27: qty 1

## 2018-05-27 MED ORDER — TRAMADOL HCL 50 MG PO TABS: 50.0000 mg | ORAL_TABLET | Freq: Four times a day (QID) | ORAL | 0 refills | Status: AC | PRN

## 2018-05-27 NOTE — ED Provider Notes (Signed)
[facility] Emergency Department Provider Note  ____________________________________________   First MD Initiated Contact with Patient 05/27/18 1315     (approximate)  I have reviewed the triage vital signs and the nursing notes.   HISTORY  Chief Complaint Back Pain   HPI Jonathan Mccarthy is a 57 y.o. male presents with low back pain for 1 month.  Patient denies any known recent injury.  He does have a history of low back pain without sciatica.  He states that he is seen by a doctor in [place] for his back problems.  He denies any urinary symptoms or history of kidney stones.  Patient denies any paresthesias, no incontinence of bowel or bladder.  Patient states that he has muscle relaxants at home.  He has been taking over-the-counter medication without any relief.  Currently rates his pain as 10/10.   History reviewed. No pertinent past medical history.  Patient Active Problem List   Diagnosis Date Noted  . Chronic bilateral low back pain without sciatica 01/21/2018  . Chronic neck and back pain 01/21/2018  . Paresthesias 01/21/2018  . Other fatigue 01/21/2018  . Cocaine abuse (HCC) 01/26/2016  . Tobacco use disorder 01/26/2016    History reviewed. No pertinent surgical history.  Prior to Admission medications   Medication Sig Start Date End Date Taking? Authorizing Provider  traMADol (ULTRAM) 50 MG tablet Take 1 tablet (50 mg total) by mouth every 6 (six) hours as needed. 05/27/18   Tommi RumpsSummers, Eliot Popper L, PA-C    Allergies Patient has no known allergies.  No family history on file.  Social History Social History   Tobacco Use  . Smoking status: Current Every Day Smoker    Types: Cigarettes  . Smokeless tobacco: Never Used  Substance Use Topics  . Alcohol use: Yes  . Drug use: No    Review of Systems Constitutional: No fever/chills Cardiovascular: Denies chest pain. Respiratory: Denies shortness of breath. Gastrointestinal: No  abdominal pain.  No nausea, no vomiting.  No diarrhea.  No constipation. Genitourinary: Negative for dysuria. Musculoskeletal: Positive for low back pain. Skin: Negative for rash. Neurological: Negative for headaches, focal weakness or numbness. ____________________________________________   PHYSICAL EXAM:  VITAL SIGNS: ED Triage Vitals  Enc Vitals Group     BP 05/27/18 1221 (!) 143/81     Pulse Rate 05/27/18 1221 (!) 118     Resp 05/27/18 1221 16     Temp 05/27/18 1221 98.2 F (36.8 C)     Temp Source 05/27/18 1221 Oral     SpO2 05/27/18 1221 100 %     Weight 05/27/18 1219 140 lb (63.5 kg)     Height 05/27/18 1219 5\' 6"  (1.676 m)     Head Circumference --      Peak Flow --      Pain Score 05/27/18 1219 10     Pain Loc --      Pain Edu? --      Excl. in GC? --     Constitutional: Alert and oriented. Well appearing and in no acute distress. Eyes: Conjunctivae are normal.  Head: Atraumatic. Nose: No congestion/rhinnorhea. Mouth/Throat: Mucous membranes are moist.  Oropharynx non-erythematous. Neck: No stridor.   Cardiovascular: Normal rate, regular rhythm. Grossly normal heart sounds.  Good peripheral circulation. Respiratory: Normal respiratory effort.  No retractions. Lungs CTAB. Gastrointestinal: Soft and nontender. No distention. No abdominal bruits. No CVA tenderness. Musculoskeletal: On examination of the back there is no gross deformity however there  is moderate tenderness on palpation of the lower lumbar and paravertebral muscles.  Range of motion is restricted secondary to discomfort and also some active muscle spasms.  Good muscle strength bilaterally.  Motor or sensory function intact. Neurologic:  Normal speech and language. No gross focal neurologic deficits are appreciated.  Reflexes were 1+ bilaterally no gait instability. Skin:  Skin is warm, dry and intact. No rash noted. Psychiatric: Mood and affect are normal. Speech and behavior are  normal.  ____________________________________________   LABS (all labs ordered are listed, but only abnormal results are displayed)  Labs Reviewed - No data to display  PROCEDURES  Procedure(s) performed: None  Procedures  Critical Care performed: No  ____________________________________________   INITIAL IMPRESSION / ASSESSMENT AND PLAN / ED COURSE  As part of my medical decision making, I reviewed the following data within the electronic MEDICAL RECORD NUMBER Notes from prior ED visits and [place] Controlled Substance Database  Patient is here with complaint of exacerbation of his chronic low back pain.  Patient recently had cervical spine surgery in [facility].  Currently he has muscle relaxants but did not have any pain medication.  He has been taking over-the-counter medication without any relief.  Patient was given tramadol while in the department along with prescription for 1 every 6 hours.  He is encouraged to follow-up with his doctor in [place].  He is also to begin taking his muscle relaxants at home.  ____________________________________________   FINAL CLINICAL IMPRESSION(S) / ED DIAGNOSES  Final diagnoses:  Acute exacerbation of chronic low back pain     ED Discharge Orders        Ordered    traMADol (ULTRAM) 50 MG tablet  Every 6 hours PRN     05/27/18 1512       Note:  This document was prepared using Dragon voice recognition software and may include unintentional dictation errors.    Tommi Rumps, PA-C 05/27/18 1618    Emily Filbert, MD 05/28/18 [phone]

## 2018-05-27 NOTE — Discharge Instructions (Addendum)
Call make an appointment with your primary care provider for continued care of your back pain.  Continue taking your muscle relaxant that you have at home and begin taking the tramadol 1 every 6 hours as needed for pain.  You may apply ice or heat to your back as needed for discomfort.  Return to the emergency department if any severe worsening of your symptoms such as loss of bowel or bladder control.

## 2018-05-27 NOTE — ED Triage Notes (Signed)
Pt c/o lower back pain that has been present for a month - ot denies any other symptoms

## 2018-05-27 NOTE — ED Notes (Signed)
Pt states lower back hurting. When attempting to move to bed pt had difficulties and needed assistance. When placing weight on legs pt legs started to give out and needed help getting onto stretcher. Pt states he has had back surgery previously and uses a walker.

## 2018-12-12 ENCOUNTER — Ambulatory Visit
Admission: RE | Admit: 2018-12-12 | Discharge: 2018-12-12 | Disposition: A | Discharge status: Home / Self Care | Payer: Disability Insurance | Source: Ambulatory Visit | Attending: Internal Medicine | Admitting: Internal Medicine

## 2018-12-12 ENCOUNTER — Ambulatory Visit
Admission: RE | Admit: 2018-12-12 | Discharge: 2018-12-12 | Disposition: A | Discharge status: Home / Self Care | Payer: Disability Insurance | Attending: Internal Medicine | Admitting: Internal Medicine

## 2018-12-12 ENCOUNTER — Other Ambulatory Visit: Payer: Self-pay | Admitting: Internal Medicine

## 2018-12-12 DIAGNOSIS — S149XXA Injury of unspecified nerves of neck, initial encounter: Secondary | ICD-10-CM | POA: Insufficient documentation

## 2018-12-12 DIAGNOSIS — G589 Mononeuropathy, unspecified: Secondary | ICD-10-CM

## 2019-01-06 IMAGING — MR MR THORACIC SPINE W/O CM
14 of 16 series · 39 of 48 positions shown · non-contrast
Comparison: None.

CLINICAL DATA: Fall with back pain, bilateral upper extremity
numbness.

EXAM:
MRI CERVICAL, THORACIC AND LUMBAR SPINE WITHOUT CONTRAST
TECHNIQUE: Multiplanar and multiecho pulse sequences of the cervical spine, to
include the craniocervical junction and cervicothoracic junction,
and thoracic and lumbar spine, were obtained without intravenous
contrast.

[Series 2: T2 · sagittal · 3.0mm · 0.70mm/px · 1 of 15 slices shown (1 of 6)]
[im 1/15]
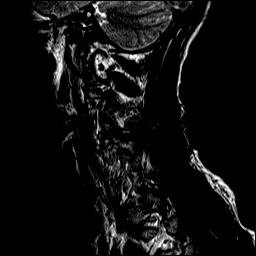

[Series 3: T1 · sagittal · 3.0mm · 0.70mm/px · 2 of 15 slices shown (1 of 4)]
[im 1/15]
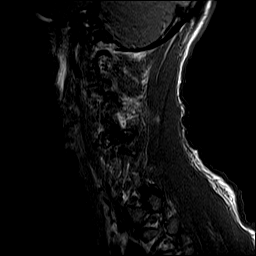
[im 15/15]
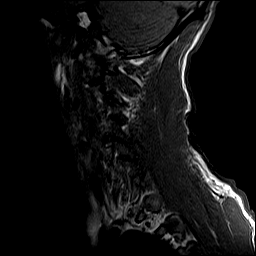

[Series 4: STIR · sagittal · 3.0mm · 0.70mm/px · 2 of 15 slices shown (1 of 3)]
[im 1/15]
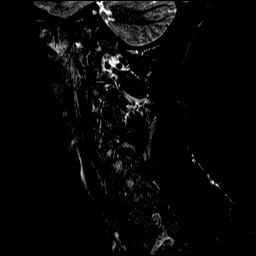
[im 15/15]
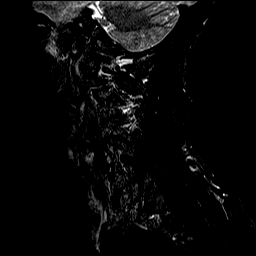

[Series 5: T2 · axial · 3.0mm · 0.70mm/px · z∈[-85,+13]mm · 4 of 27 slices shown (2 of 6)]
[im 1/27]
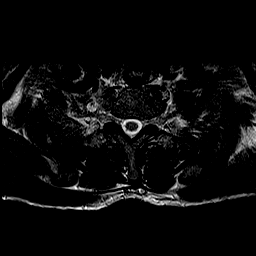
[im 9/27]
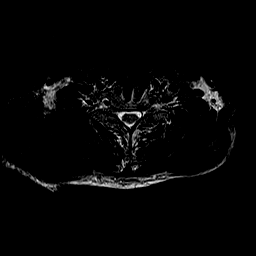
[im 18/27]
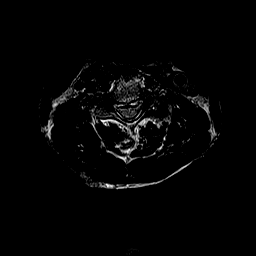
[im 27/27]
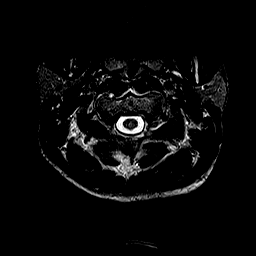

[Series 6: mpgr ax · axial · 3.0mm · 0.35mm/px · z∈[-85,-55]mm · 2 of 27 slices shown]
[im 1/27]
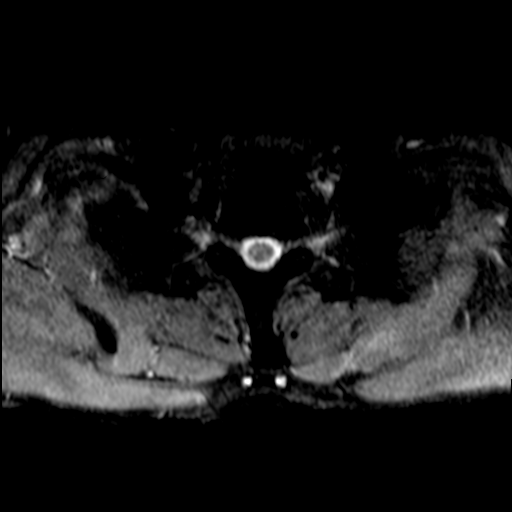
[im 9/27]
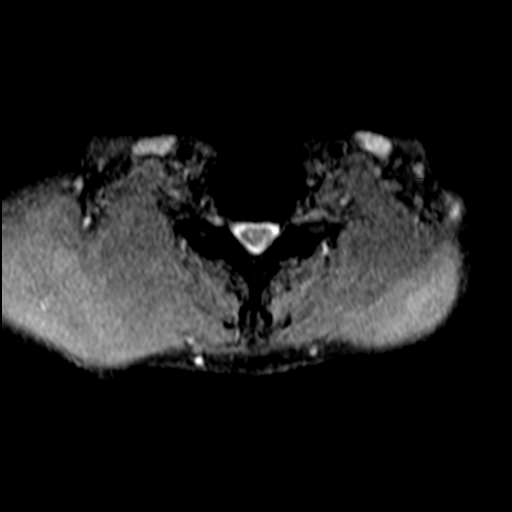

[Series 9: T2 · sagittal · 4.0mm · 1.33mm/px · 2 of 13 slices shown (3 of 6)]
[im 1/13]
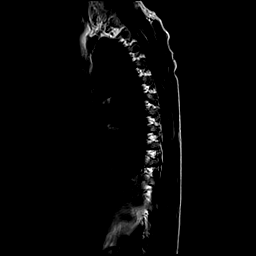
[im 13/13]
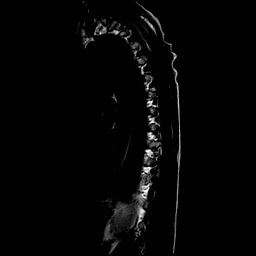

[Series 10: T1 · sagittal · 4.0mm · 1.33mm/px · 2 of 13 slices shown (2 of 4)]
[im 1/13]
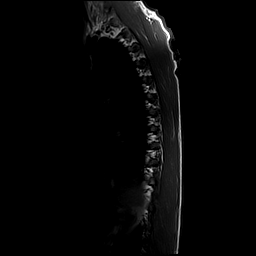
[im 13/13]
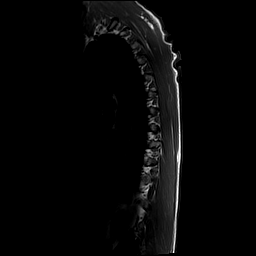

[Series 11: STIR · sagittal · 4.0mm · 1.33mm/px · 2 of 13 slices shown (2 of 3)]
[im 1/13]
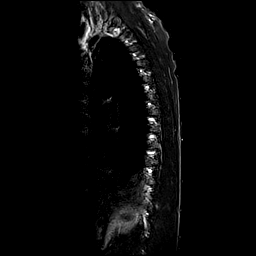
[im 13/13]
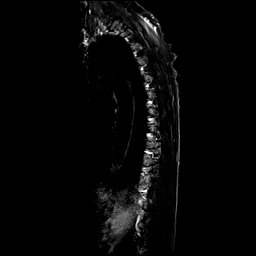

[Series 12: T2 · axial · 5.0mm · 0.86mm/px · z∈[-304,-75]mm · 6 of 42 slices shown (4 of 6)]
[im 1/42]
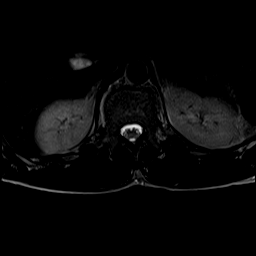
[im 9/42]
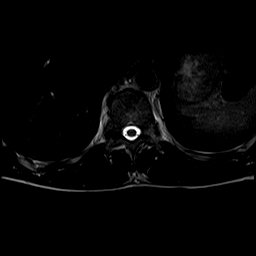
[im 17/42]
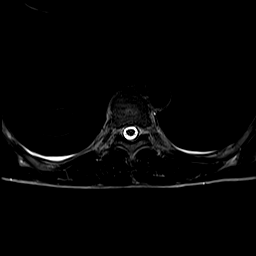
[im 25/42]
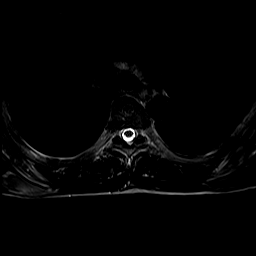
[im 33/42]
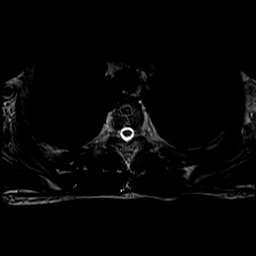
[im 42/42]
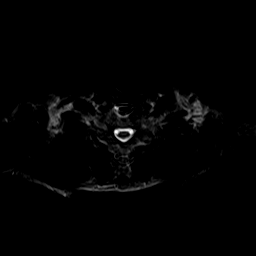

[Series 15: T2 · sagittal · 4.0mm · 1.02mm/px · 2 of 18 slices shown (5 of 6)]
[im 1/18]
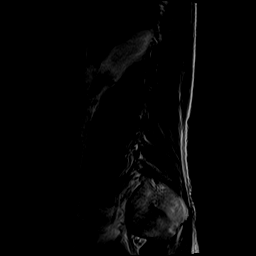
[im 18/18]
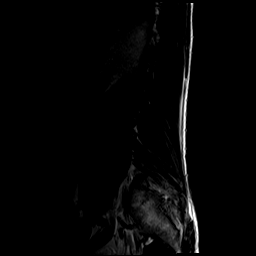

[Series 16: T1 · sagittal · 4.0mm · 1.02mm/px · 2 of 18 slices shown (3 of 4)]
[im 1/18]
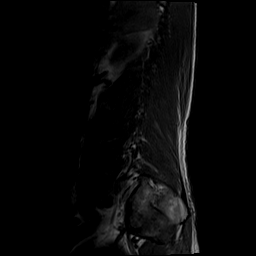
[im 18/18]
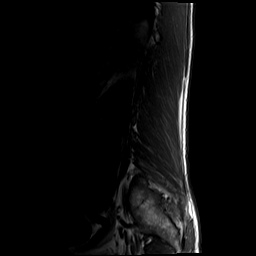

[Series 17: STIR · sagittal · 4.0mm · 1.02mm/px · 2 of 18 slices shown (3 of 3)]
[im 1/18]
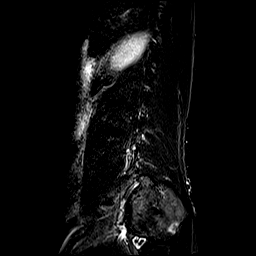
[im 18/18]
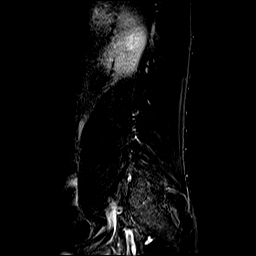

[Series 19: T1 · axial · 4.0mm · 0.39mm/px · z∈[-498,-294]mm · 5 of 36 slices shown (4 of 4)]
[im 1/36]
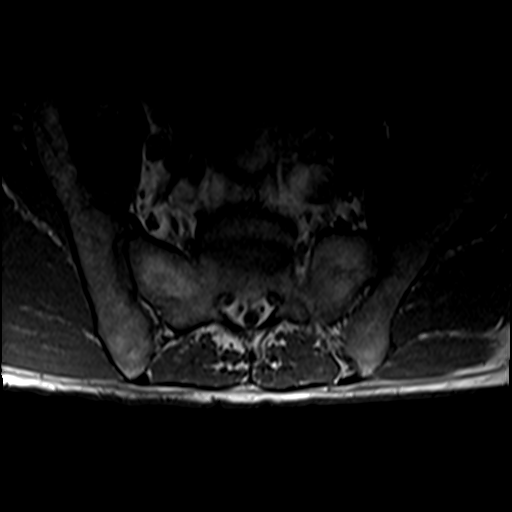
[im 9/36]
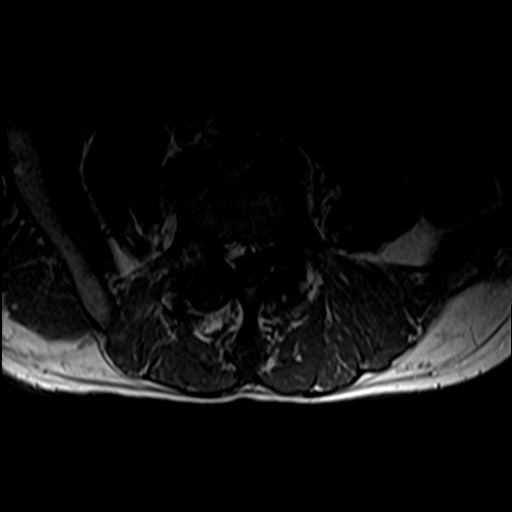
[im 18/36]
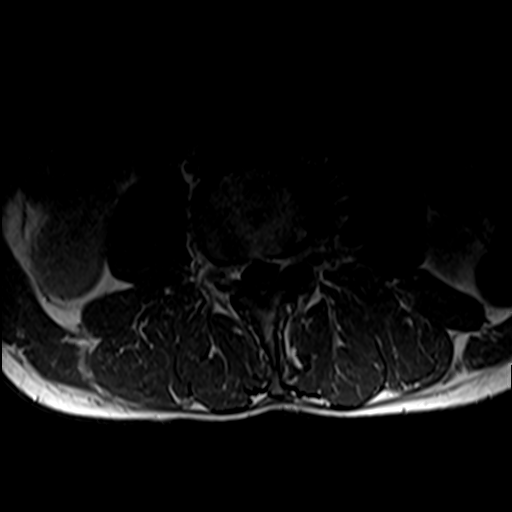
[im 27/36]
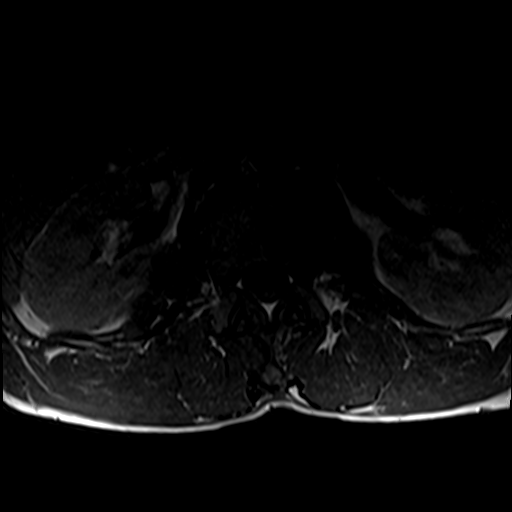
[im 36/36]
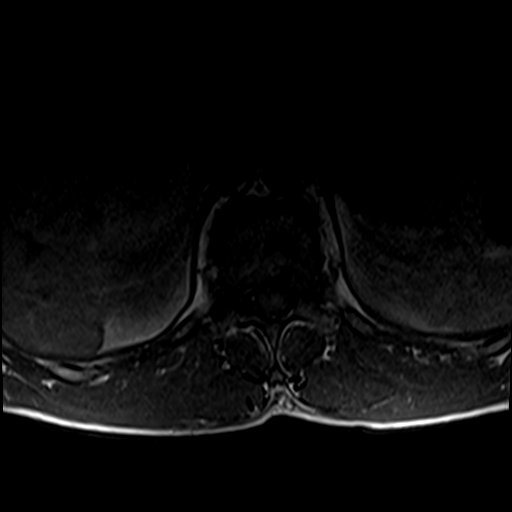

[Series 20: T2 · axial · 4.0mm · 0.78mm/px · z∈[-498,-294]mm · 5 of 36 slices shown (6 of 6)]
[im 1/36]
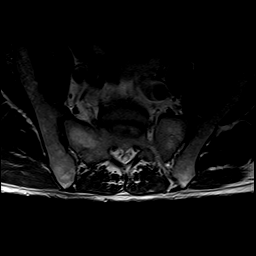
[im 9/36]
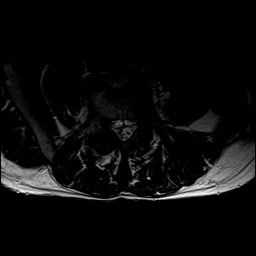
[im 18/36]
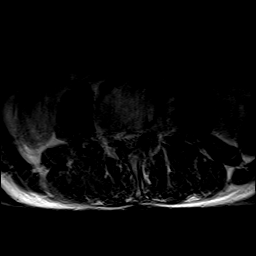
[im 27/36]
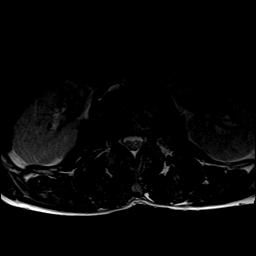
[im 36/36]
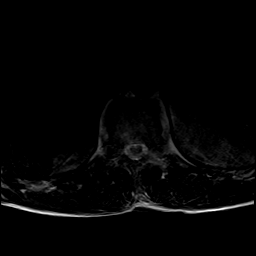

[39 of 48 positions shown; findings below may reference images not displayed]

FINDINGS: MRI CERVICAL SPINE FINDINGS

Alignment: Physiologic.

Vertebrae: There is edema of the posterior elements at the C4-5
level. No other focal marrow signal abnormality. There is edema
underlying the ligamentum flavum at the C3-C5 levels but no focal
interruption of the ligament. Posterior longitudinal ligament is
intact. There is mild edema of the interspinous ligament. C6-C7
ACDF.

Cord: There is hyperintense T2-weighted signal within the spinal
cord at the C4-5 level. The remainder of the cord parenchymal signal
is normal.

Posterior Fossa, vertebral arteries, paraspinal tissues: Visualized
posterior fossa is normal. Vertebral artery flow voids are
preserved. No prevertebral soft tissue swelling.

Disc levels:

C1-C2: No stenosis.

C2-C3: Medium-sized central disc protrusion with mild spinal canal
stenosis. Mild-to-moderate narrowing of the right neural foramen.

C3-C4: Small disc osteophyte complex with mild spinal canal
stenosis. Mild-to-moderate bilateral foraminal narrowing.

C4-C5: Posterior element edema and possible spinous process
fracture. Large disc osteophyte complex with severe spinal canal
stenosis and mild compression of the spinal cord. Severe left facet
hypertrophy.

C5-C6: Mild disc bulge. No stenosis. Mild uncovertebral hypertrophy.

C6-C7: Disc space narrowing without herniation or stenosis. Anterior
fusion.

C7-T1: No herniation or stenosis.

MRI THORACIC SPINE FINDINGS

Alignment:  Physiologic.

Vertebrae: No fracture, evidence of discitis, or bone lesion.

Cord:  Normal signal and morphology.

Paraspinal and other soft tissues: Trace pleural effusions.

Disc levels:

Mild multilevel degenerative disc disease without stenosis.

MRI LUMBAR SPINE FINDINGS

Segmentation:  Standard.

Alignment:  Physiologic.

Vertebrae: Multilevel degenerative endplate signal change. No acute
fracture.

Conus medullaris and cauda equina: Conus extends to the L1 level.
Conus and cauda equina appear normal.

Paraspinal and other soft tissues: The visualized vascular,
retroperitoneal and paraspinal structures are normal.

Disc levels:

T12-L1: Right subarticular/foraminal protrusion with mild foraminal
narrowing.

L1-L2: No herniation or stenosis.

L2-L3: Mild diffuse disc bulge. No spinal canal stenosis. Mild left
foraminal narrowing.

L3-L4: Diffuse disc bulge with mild right and moderate left
foraminal stenosis.

L4-L5: Medium-sized diffuse disc bulge. No spinal canal stenosis.
Left-greater-than-right lateral recess stenosis. Moderate right and
severe left neural foraminal stenosis.

L5-S1: Disc space narrowing without spinal canal stenosis. Severe
bilateral neural foraminal stenosis. Severe facet hypertrophy.
IMPRESSION: 1. Compression of the cervical spinal cord at the C4-5 level with
short segment spinal cord edema.
2. Edema within the C4-5 posterior elements and underlying the
ligamentum flavum. In the setting of recent fall, this is concerning
for acute cervical spine fracture. CT of the cervical spine is
recommended to more completely characterized the bones. No
ligamentous interruption.
3. Multilevel mild-to-moderate foraminal stenosis of the cervical
spine.
4. No large disc herniation or stenosis of thoracic spine.
5. Multilevel lumbar degenerative disc disease without spinal canal
stenosis. Moderate-to-severe neural foraminal stenosis at L3-4, L4-5
and L5-S1.

Critical Value/emergent results were called by telephone at the time
of interpretation on 02/04/2018 at [DATE] to Dr. BONFANTI ZABEO ,
who verbally acknowledged these results.
